# Patient Record
Sex: Female | Born: 1985 | Race: White | Hispanic: Yes | Marital: Single | Smoking: Never smoker
Health system: Southern US, Community
[De-identification: ages and names within clinical notes are randomized; demographics above are authoritative.]

## PROBLEM LIST (undated history)

## (undated) DIAGNOSIS — F988 Other specified behavioral and emotional disorders with onset usually occurring in childhood and adolescence: Secondary | ICD-10-CM

---

## 2005-08-12 ENCOUNTER — Emergency Department (HOSPITAL_COMMUNITY): Admission: EM | Admit: 2005-08-12 | Discharge: 2005-08-12 | Payer: Self-pay | Admitting: Emergency Medicine

## 2009-02-13 ENCOUNTER — Emergency Department (HOSPITAL_COMMUNITY): Admission: EM | Admit: 2009-02-13 | Discharge: 2009-02-13 | Payer: Self-pay | Admitting: Emergency Medicine

## 2011-08-10 ENCOUNTER — Emergency Department (HOSPITAL_COMMUNITY)
Admission: EM | Admit: 2011-08-10 | Discharge: 2011-08-10 | Disposition: A | Discharge status: Home / Self Care | Payer: PRIVATE HEALTH INSURANCE | Attending: Emergency Medicine | Admitting: Emergency Medicine

## 2011-08-10 ENCOUNTER — Emergency Department (HOSPITAL_COMMUNITY): Payer: PRIVATE HEALTH INSURANCE

## 2011-08-10 DIAGNOSIS — S6990XA Unspecified injury of unspecified wrist, hand and finger(s), initial encounter: Secondary | ICD-10-CM | POA: Insufficient documentation

## 2011-08-10 DIAGNOSIS — R51 Headache: Secondary | ICD-10-CM | POA: Insufficient documentation

## 2011-08-10 DIAGNOSIS — M79609 Pain in unspecified limb: Secondary | ICD-10-CM | POA: Insufficient documentation

## 2011-08-10 DIAGNOSIS — S59909A Unspecified injury of unspecified elbow, initial encounter: Secondary | ICD-10-CM | POA: Insufficient documentation

## 2011-08-10 DIAGNOSIS — IMO0002 Reserved for concepts with insufficient information to code with codable children: Secondary | ICD-10-CM | POA: Insufficient documentation

## 2011-12-28 ENCOUNTER — Encounter (HOSPITAL_COMMUNITY): Payer: Self-pay | Admitting: Emergency Medicine

## 2011-12-28 ENCOUNTER — Emergency Department (INDEPENDENT_AMBULATORY_CARE_PROVIDER_SITE_OTHER)
Admission: EM | Admit: 2011-12-28 | Discharge: 2011-12-28 | Disposition: A | Discharge status: Home / Self Care | Payer: PRIVATE HEALTH INSURANCE | Source: Home / Self Care | Attending: Emergency Medicine | Admitting: Emergency Medicine

## 2011-12-28 DIAGNOSIS — H9312 Tinnitus, left ear: Secondary | ICD-10-CM

## 2011-12-28 DIAGNOSIS — H9319 Tinnitus, unspecified ear: Secondary | ICD-10-CM

## 2011-12-28 MED ORDER — MECLIZINE HCL 12.5 MG PO TABS: 12.5000 mg | ORAL_TABLET | Freq: Three times a day (TID) | ORAL | Status: AC | PRN

## 2011-12-28 NOTE — Discharge Instructions (Signed)
   As discussed during your exam no signs of obvious infection or eardrum perforation were noted. I assume you're symptom and discomfort will improve rapidly. If symptoms persist beyond 7-10 days or new symptoms as discussed as nausea vomiting or changes in balance or equilibrium followup with the ENT doctor. Take his prescribed medicine every 8 hours for the next 3 days.   Tinnitus Sounds you hear in your ears and coming from within the ear is called tinnitus. This can be a symptom of many ear disorders. It is often associated with hearing loss.  Tinnitus can be seen with:  Infections.   Ear blockages such as wax buildup.   Meniere's disease.   Ear damage.   Inherited.   Occupational causes.  While irritating, it is not usually a threat to health. When the cause of the tinnitus is wax, infection in the middle ear, or foreign body it is easily treated. Hearing loss will usually be reversible.  TREATMENT  When treating the underlying cause does not get rid of tinnitus, it may be necessary to get rid of the unwanted sound by covering it up with more pleasant background noises. This may include music, the radio etc. There are tinnitus maskers which can be worn which produce background noise to cover up the tinnitus. Avoid all medications which tend to make tinnitus worse such as alcohol, caffeine, aspirin, and nicotine. There are many soothing background tapes such as rain, ocean, thunderstorms, etc. These soothing sounds help with sleeping or resting. Keep all follow-up appointments and referrals. This is important to identify the cause of the problem. It also helps avoid complications, impaired hearing, disability, or chronic pain. Document Released: 10/28/2005 Document Revised: 07/10/2011 Document Reviewed: 06/15/2008 [facility] Patient Information 2012 [place], [place].

## 2011-12-28 NOTE — ED Notes (Signed)
Ringing in left ear onset yesterday around 3:30 P.M.initially hearing was "muted"

## 2011-12-28 NOTE — ED Provider Notes (Addendum)
History     CSN: [id number]  Arrival date & time 12/28/11  1509   First MD Initiated Contact with Patient 12/28/11 1559      Chief Complaint  Patient presents with  . Tinnitus    (Consider location/radiation/quality/duration/timing/severity/associated sxs/prior treatment) HPI Comments: Patient presents to the urgent care Center after having experienced a sudden onset of left ear ringing sensation in discomfort. She was at occupational health and she felt that a machine generated a sound in which she felt immediately this discomfort in her ear and have been feeling this ringing since then. Attempted several things as recommended by her mother including a bottle of perfume a cancer mastoid process and also vaporizing chest rub in her ear canal.  Patient and denies any other symptoms like fever vomiting or visual changes denies dizziness or vertigo. Admits that today is less intense but still feel this ringing sound. Denies any ear injury or trauma or any ear discharge and no recent and/or or outdoor swimming activities. Denies cleaning her years recently.  The history is provided by the patient.    History reviewed. No pertinent past medical history.  History reviewed. No pertinent past surgical history.  Family History  Problem Relation Age of Onset  . Diabetes Father   . Hypertension Father     History  Substance Use Topics  . Smoking status: Never Smoker   . Smokeless tobacco: Not on file  . Alcohol Use: No    OB History    Grav Para Term Preterm Abortions TAB SAB Ect Mult Living                  Review of Systems  Constitutional: Negative for fever, activity change and appetite change.  HENT: Positive for ear pain and tinnitus. Negative for congestion, rhinorrhea, mouth sores, trouble swallowing, neck pain, dental problem and voice change.   Eyes: Negative for visual disturbance.  Respiratory: Negative for cough and shortness of breath.   Cardiovascular: Negative  for chest pain.  Gastrointestinal: Negative for nausea and vomiting.  Skin: Negative for rash.  Neurological: Negative for dizziness, weakness and headaches.    Allergies  Honey; Pineapple; Pollen extract; Shellfish allergy; and Sudafed  Home Medications   Current Outpatient Rx  Name Route Sig Dispense Refill  . VAPORIZING CHEST RUB 4.8-1.2-2.6 % EX OINT Apply externally Apply topically.    . DESOGESTREL-ETHINYL ESTRADIOL 0.15-30 MG-MCG PO TABS Oral Take 1 tablet by mouth daily.    Marland Kitchen OVER THE COUNTER MEDICATION  Ring relief ear drops bought at [facility]    . MECLIZINE HCL 12.5 MG PO TABS Oral Take 1 tablet (12.5 mg total) by mouth 3 (three) times daily as needed. 15 tablet 0    BP 130/79  Pulse 72  Temp(Src) 99.3 F (37.4 C) (Oral)  Resp 20  SpO2 100%  LMP 12/25/2011  Physical Exam  Nursing note and vitals reviewed. Constitutional: She appears well-developed and well-nourished.  HENT:  Head: Normocephalic.  Right Ear: Hearing, tympanic membrane and ear canal normal. No lacerations. No drainage, swelling or tenderness. No foreign bodies. No mastoid tenderness. Tympanic membrane is not injected, not perforated, not retracted and not bulging. No middle ear effusion. No hemotympanum.  Left Ear: Hearing, external ear and ear canal normal. No lacerations. No drainage, swelling or tenderness. No foreign bodies. No mastoid tenderness. Tympanic membrane is not injected, not perforated, not retracted and not bulging.  No middle ear effusion. No hemotympanum.  Ears:  Mouth/Throat: No oropharyngeal exudate.  Eyes: Conjunctivae are normal. Pupils are equal, round, and reactive to light. Right eye exhibits no discharge. Left eye exhibits no discharge. Left eye exhibits normal extraocular motion and no nystagmus.    Neck: Neck supple. No JVD present.  Skin: Skin is warm. No erythema.    ED Course  Procedures (including critical care time)  Labs Reviewed - No data to display No results  found.   1. Tinnitus of left ear       MDM  Left ear tinnitus sudden onset yesterday after a machine and do sound patient described hearing was muted and since then been perceiving this ringing sound much more intense yesterday today seems less and intermittent in nature. Patient denies any visual changes headache or disequilibrium and no gastrointestinal symptoms.        Jimmie Molly, MD 12/28/11 1648  Jimmie Molly, MD 12/28/11 [phone]

## 2012-04-30 ENCOUNTER — Encounter (HOSPITAL_COMMUNITY): Payer: Self-pay | Admitting: Family Medicine

## 2012-04-30 ENCOUNTER — Emergency Department (HOSPITAL_COMMUNITY)
Admission: EM | Admit: 2012-04-30 | Discharge: 2012-04-30 | Disposition: A | Discharge status: Home / Self Care | Payer: PRIVATE HEALTH INSURANCE | Attending: Emergency Medicine | Admitting: Emergency Medicine

## 2012-04-30 DIAGNOSIS — R197 Diarrhea, unspecified: Secondary | ICD-10-CM | POA: Insufficient documentation

## 2012-04-30 DIAGNOSIS — R42 Dizziness and giddiness: Secondary | ICD-10-CM | POA: Insufficient documentation

## 2012-04-30 DIAGNOSIS — Z833 Family history of diabetes mellitus: Secondary | ICD-10-CM | POA: Insufficient documentation

## 2012-04-30 DIAGNOSIS — R112 Nausea with vomiting, unspecified: Secondary | ICD-10-CM | POA: Insufficient documentation

## 2012-04-30 DIAGNOSIS — Z8249 Family history of ischemic heart disease and other diseases of the circulatory system: Secondary | ICD-10-CM | POA: Insufficient documentation

## 2012-04-30 LAB — POCT I-STAT, CHEM 8
BUN: 7 mg/dL (ref 6–23)
Calcium, Ion: 1.21 mmol/L (ref 1.12–1.32)
Chloride: 105 meq/L (ref 96–112)
Creatinine, Ser: 0.7 mg/dL (ref 0.50–1.10)
Glucose, Bld: 78 mg/dL (ref 70–99)
HCT: 38 % (ref 36.0–46.0)
Hemoglobin: 12.9 g/dL (ref 12.0–15.0)
Potassium: 3.4 meq/L — ABNORMAL LOW (ref 3.5–5.1)
Sodium: 143 meq/L (ref 135–145)
TCO2: 24 mmol/L (ref 0–100)

## 2012-04-30 LAB — URINALYSIS, ROUTINE W REFLEX MICROSCOPIC
Bilirubin Urine: NEGATIVE
Glucose, UA: NEGATIVE mg/dL
Hgb urine dipstick: NEGATIVE
Ketones, ur: NEGATIVE mg/dL
Leukocytes, UA: NEGATIVE
Nitrite: NEGATIVE
Protein, ur: NEGATIVE mg/dL
Specific Gravity, Urine: 1.005 (ref 1.005–1.030)
Urobilinogen, UA: 0.2 mg/dL (ref 0.0–1.0)
pH: 6 (ref 5.0–8.0)

## 2012-04-30 LAB — PREGNANCY, URINE: Preg Test, Ur: NEGATIVE

## 2012-04-30 MED ORDER — SODIUM CHLORIDE 0.9 % IV SOLN
1000.0000 mL | INTRAVENOUS | Status: DC
  Administered 2012-04-30: 1000 mL via INTRAVENOUS

## 2012-04-30 MED ORDER — SODIUM CHLORIDE 0.9 % IV SOLN
1000.0000 mL | Freq: Once | INTRAVENOUS | Status: AC
  Administered 2012-04-30: 1000 mL via INTRAVENOUS

## 2012-04-30 MED ORDER — ONDANSETRON HCL 4 MG/2ML IJ SOLN
4.0000 mg | Freq: Once | INTRAMUSCULAR | Status: AC
  Administered 2012-04-30: 4 mg via INTRAVENOUS
  Filled 2012-04-30: qty 2

## 2012-04-30 MED ORDER — LOPERAMIDE HCL 2 MG PO CAPS: 2.0000 mg | ORAL_CAPSULE | Freq: Four times a day (QID) | ORAL | Status: AC | PRN

## 2012-04-30 MED ORDER — ONDANSETRON 8 MG PO TBDP: 8.0000 mg | ORAL_TABLET | Freq: Three times a day (TID) | ORAL | Status: AC | PRN

## 2012-04-30 NOTE — ED Notes (Signed)
Pt ambulating independently w/ steady gait on d/c in no acute distress, A&Ox4. D/c instructions reviewed w/ pt - pt denies any further questions or concerns at present.  

## 2012-04-30 NOTE — ED Notes (Signed)
Patient states "I think I am dehydrated." Reports nausea x 3 days, diarrhea x 10 episodes and vomiting x 2 episodes. Tried sodium bicarb and pepto bismal without relief. States "my stomach feels really swollen."

## 2012-04-30 NOTE — ED Provider Notes (Signed)
History     CSN: [id number]  Arrival date & time 04/30/12  0114   First MD Initiated Contact with Patient 04/30/12 [phone]      Chief Complaint  Patient presents with  . Nausea  . Diarrhea     HPI Per nursing notes: Patient states "I think I am dehydrated." Reports nausea x 3 days, diarrhea x 10 episodes and vomiting x 2 episodes. Tried sodium bicarb and pepto bismal without relief. States "my stomach feels really swollen." Symptoms started Monday am.  Pt has felt lightheaded and dizzy.  No abdominal pain.  No known fever.  Sometimes some stomach cramping.  PT has not been able to keep anything down.    History reviewed. No pertinent past medical history.  History reviewed. No pertinent past surgical history.  Family History  Problem Relation Age of Onset  . Diabetes Father   . Hypertension Father     History  Substance Use Topics  . Smoking status: Never Smoker   . Smokeless tobacco: Not on file  . Alcohol Use: No    OB History    Grav Para Term Preterm Abortions TAB SAB Ect Mult Living                  Review of Systems  Constitutional: Negative for fever.  Gastrointestinal: Negative for vomiting.  Genitourinary: Negative for dysuria.  Neurological: Positive for dizziness. Negative for syncope.  All other systems reviewed and are negative.    Allergies  Honey; Pineapple; Pollen extract; Shellfish allergy; and Sudafed  Home Medications  No current outpatient prescriptions on file.  BP 113/84  Pulse 83  Temp 98.2 F (36.8 C) (Oral)  Resp 16  SpO2 100%  LMP 04/29/2012  Physical Exam  Nursing note and vitals reviewed. Constitutional: She appears well-developed and well-nourished. No distress.  HENT:  Head: Normocephalic and atraumatic.  Right Ear: External ear normal.  Left Ear: External ear normal.  Eyes: Conjunctivae are normal. Right eye exhibits no discharge. Left eye exhibits no discharge. No scleral icterus.  Neck: Neck supple. No tracheal  deviation present.  Cardiovascular: Normal rate, regular rhythm and intact distal pulses.   Pulmonary/Chest: Effort normal and breath sounds normal. No stridor. No respiratory distress. She has no wheezes. She has no rales.  Abdominal: Soft. Bowel sounds are normal. She exhibits no distension. There is no tenderness. There is no rebound and no guarding.  Musculoskeletal: She exhibits no edema and no tenderness.  Neurological: She is alert. She has normal strength. No sensory deficit. Cranial nerve deficit:  no gross defecits noted. She exhibits normal muscle tone. She displays no seizure activity. Coordination normal.  Skin: Skin is warm and dry. No rash noted.  Psychiatric: She has a normal mood and affect.    ED Course  Procedures (including critical care time)  Labs Reviewed  POCT I-STAT, CHEM 8 - Abnormal; Notable for the following:    Potassium 3.4 (*)     All other components within normal limits  URINALYSIS, ROUTINE W REFLEX MICROSCOPIC  PREGNANCY, URINE   No results found.   1. Diarrhea       MDM  Pt without signs of dehydration.  No abdominal ttp.  Possible viral etiology.  Will dc home on oral antiemetics and antidiarrheal agents for symptomatic relief.  Follow up with PCP if not getting better.        Celene Kras, MD 04/30/12 [phone]

## 2012-04-30 NOTE — Discharge Instructions (Signed)
Diet for Diarrhea, Adult Having frequent, runny stools (diarrhea) has many causes. Diarrhea may be caused or worsened by food or drink. Diarrhea may be relieved by changing your diet. IF YOU ARE NOT TOLERATING SOLID FOODS:  Drink enough water and fluids to keep your urine clear or pale yellow.   Avoid sugary drinks and sodas as well as milk-based beverages.   Avoid beverages containing caffeine and alcohol.   You may try rehydrating beverages. You can make your own by following this recipe:    tsp table salt.    tsp baking soda.   ? tsp salt substitute (potassium chloride).   1 tbs + 1 tsp sugar.   1 qt water.  As your stools become more solid, you can start eating solid foods. Add foods one at a time. If a certain food causes your diarrhea to get worse, avoid that food and try other foods. A low fiber, low-fat, and lactose-free diet is recommended. Small, frequent meals may be better tolerated.  Starches  Allowed:  White, French, and pita breads, plain rolls, buns, bagels. Plain muffins, matzo. Soda, saltine, or graham crackers. Pretzels, melba toast, zwieback. Cooked cereals made with water: cornmeal, farina, cream cereals. Dry cereals: refined corn, wheat, rice. Potatoes prepared any way without skins, refined macaroni, spaghetti, noodles, refined rice.   Avoid:  Bread, rolls, or crackers made with whole wheat, multi-grains, rye, bran seeds, nuts, or coconut. Corn tortillas or taco shells. Cereals containing whole grains, multi-grains, bran, coconut, nuts, or raisins. Cooked or dry oatmeal. Coarse wheat cereals, granola. Cereals advertised as "high-fiber." Potato skins. Whole grain pasta, wild or brown rice. Popcorn. Sweet potatoes/yams. Sweet rolls, doughnuts, waffles, pancakes, sweet breads.  Vegetables  Allowed: Strained tomato and vegetable juices. Most well-cooked and canned vegetables without seeds. Fresh: Tender lettuce, cucumber without the skin, cabbage, spinach, bean  sprouts.   Avoid: Fresh, cooked, or canned: Artichokes, baked beans, beet greens, broccoli, Brussels sprouts, corn, kale, legumes, peas, sweet potatoes. Cooked: Green or red cabbage, spinach. Avoid large servings of any vegetables, because vegetables shrink when cooked, and they contain more fiber per serving than fresh vegetables.  Fruit  Allowed: All fruit juices except prune juice. Cooked or canned: Apricots, applesauce, cantaloupe, cherries, fruit cocktail, grapefruit, grapes, kiwi, mandarin oranges, peaches, pears, plums, watermelon. Fresh: Apples without skin, ripe banana, grapes, cantaloupe, cherries, grapefruit, peaches, oranges, plums. Keep servings limited to  cup or 1 piece.   Avoid: Fresh: Apple with skin, apricots, mango, pears, raspberries, strawberries. Prune juice, stewed or dried prunes. Dried fruits, raisins, dates. Large servings of all fresh fruits.  Meat and Meat Substitutes  Allowed: Ground or well-cooked tender beef, ham, veal, lamb, pork, or poultry. Eggs, plain cheese. Fish, oysters, shrimp, lobster, other seafoods. Liver, organ meats.   Avoid: Tough, fibrous meats with gristle. Peanut butter, smooth or chunky. Cheese, nuts, seeds, legumes, dried peas, beans, lentils.  Milk  Allowed: Yogurt, lactose-free milk, kefir, drinkable yogurt, buttermilk, soy milk.   Avoid: Milk, chocolate milk, beverages made with milk, such as milk shakes.  Soups  Allowed: Bouillon, broth, or soups made from allowed foods. Any strained soup.   Avoid: Soups made from vegetables that are not allowed, cream or milk-based soups.  Desserts and Sweets  Allowed: Sugar-free gelatin, sugar-free frozen ice pops made without sugar alcohol.   Avoid: Plain cakes and cookies, pie made with allowed fruit, pudding, custard, cream pie. Gelatin, fruit, ice, sherbet, frozen ice pops. Ice cream, ice milk without nuts. Plain hard candy,   honey, jelly, molasses, syrup, sugar, chocolate syrup, gumdrops,  marshmallows.  Fats and Oils  Allowed: Avoid any fats and oils.   Avoid: Seeds, nuts, olives, avocados. Margarine, butter, cream, mayonnaise, salad oils, plain salad dressings made from allowed foods. Plain gravy, crisp bacon without rind.  Beverages  Allowed: Water, decaffeinated teas, oral rehydration solutions, sugar-free beverages.   Avoid: Fruit juices, caffeinated beverages (coffee, tea, soda or pop), alcohol, sports drinks, or lemon-lime soda or pop.  Condiments  Allowed: Ketchup, mustard, horseradish, vinegar, cream sauce, cheese sauce, cocoa powder. Spices in moderation: allspice, basil, bay leaves, celery powder or leaves, cinnamon, cumin powder, curry powder, ginger, mace, marjoram, onion or garlic powder, oregano, paprika, parsley flakes, ground pepper, rosemary, sage, savory, tarragon, thyme, turmeric.   Avoid: Coconut, honey.  Weight Monitoring: Weigh yourself every day. You should weigh yourself in the morning after you urinate and before you eat breakfast. Wear the same amount of clothing when you weigh yourself. Record your weight daily. Bring your recorded weights to your clinic visits. Tell your caregiver right away if you have gained 3 lb/1.4 kg or more in 1 day, 5 lb/2.3 kg in a week, or whatever amount you were told to report. SEEK IMMEDIATE MEDICAL CARE IF:   You are unable to keep fluids down.   You start to throw up (vomit) or diarrhea keeps coming back (persistent).   Abdominal pain develops, increases, or can be felt in one place (localizes).   You have an oral temperature above 102 F (38.9 C), not controlled by medicine.   Diarrhea contains blood or mucus.   You develop excessive weakness, dizziness, fainting, or extreme thirst.  MAKE SURE YOU:   Understand these instructions.   Will watch your condition.   Will get help right away if you are not doing well or get worse.  Document Released: 01/18/2004 Document Revised: 10/17/2011 Document Reviewed:  05/11/2009 [employer] Patient Information 2012 [employer]. 

## 2012-04-30 NOTE — ED Notes (Signed)
Pt reports multiple episodes of loose stools x3 days, pt states she has been feeling weak d/t this and experiencing nausea as well. Pt unsure if any fever present. Pt A&Ox4 in no acute distress.

## 2012-10-06 ENCOUNTER — Ambulatory Visit (INDEPENDENT_AMBULATORY_CARE_PROVIDER_SITE_OTHER): Payer: BC Managed Care – PPO | Admitting: Sports Medicine

## 2012-10-06 VITALS — BP 100/62 | Ht 62.5 in | Wt 135.0 lb

## 2012-10-06 DIAGNOSIS — M79609 Pain in unspecified limb: Secondary | ICD-10-CM

## 2012-10-06 DIAGNOSIS — M79671 Pain in right foot: Secondary | ICD-10-CM | POA: Insufficient documentation

## 2012-10-06 DIAGNOSIS — R269 Unspecified abnormalities of gait and mobility: Secondary | ICD-10-CM

## 2012-10-06 NOTE — Assessment & Plan Note (Signed)
She was given lateral heel wedges to use bilaterally  She is to test these for one month but if they help we will her to use them in all shoes  We also gave her a series of exercises to strengthen her arch muscles with heel raises in 3 positions  Recheck when necessary

## 2012-10-06 NOTE — Progress Notes (Signed)
  Subjective:    Patient ID: Debra Howard, female    DOB: 1986/01/29, 26 y.o.   MRN: [id number]  HPI  Pt presents to clinic for evaluation of forefoot and lateral foot pain x 6 months. Shoes wear out laterally. Has pain with increased standing and walking. 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   History of injury to the feet She has not tried any corrections She does not see any difference in any of her shoes     Review of Systems     Objective:   Physical Exam  Pleasant and in no acute distress  Bilateral feet show any loss of the longitudinal arch There is a short first metatarsal segment bilaterally The second toe is approximately 1 cm longer than the first bilaterally There is no tenderness to palpation of either foot No abnormal calluses  Walking gait reveals that she has excess supination and then some increase in compensatory pronation  When we correct the supination she is more comfortable her walking gait were normal as was      Assessment & Plan:

## 2012-10-06 NOTE — Patient Instructions (Addendum)
Please try heel wedges - with thick part towards the outside of your heel  Do 3 sets of 15 heel raises daily   1 set with toes pointed out 1 set with toes pointed in  1 set of toes pointed forward  If this correction in your shoes helps, stop by our office and we will show you how to order additional pads  If the correction does not work please make a follow up appointment  Thank you for seeing [place] today!

## 2012-10-06 NOTE — Assessment & Plan Note (Signed)
I think this is primarily bile mechanical from her abnormal gait  I do not see a specific injury

## 2014-07-03 ENCOUNTER — Encounter (HOSPITAL_COMMUNITY): Payer: Self-pay | Admitting: Emergency Medicine

## 2014-07-03 ENCOUNTER — Emergency Department (HOSPITAL_COMMUNITY): Payer: 59

## 2014-07-03 ENCOUNTER — Emergency Department (HOSPITAL_COMMUNITY)
Admission: EM | Admit: 2014-07-03 | Discharge: 2014-07-03 | Disposition: A | Discharge status: Home / Self Care | Payer: 59 | Attending: Emergency Medicine | Admitting: Emergency Medicine

## 2014-07-03 DIAGNOSIS — R51 Headache: Secondary | ICD-10-CM

## 2014-07-03 DIAGNOSIS — Z79899 Other long term (current) drug therapy: Secondary | ICD-10-CM | POA: Diagnosis not present

## 2014-07-03 DIAGNOSIS — W1809XA Striking against other object with subsequent fall, initial encounter: Secondary | ICD-10-CM | POA: Insufficient documentation

## 2014-07-03 DIAGNOSIS — S46909A Unspecified injury of unspecified muscle, fascia and tendon at shoulder and upper arm level, unspecified arm, initial encounter: Secondary | ICD-10-CM | POA: Diagnosis not present

## 2014-07-03 DIAGNOSIS — S4980XA Other specified injuries of shoulder and upper arm, unspecified arm, initial encounter: Secondary | ICD-10-CM | POA: Diagnosis not present

## 2014-07-03 DIAGNOSIS — F988 Other specified behavioral and emotional disorders with onset usually occurring in childhood and adolescence: Secondary | ICD-10-CM | POA: Diagnosis not present

## 2014-07-03 DIAGNOSIS — S060X0A Concussion without loss of consciousness, initial encounter: Secondary | ICD-10-CM

## 2014-07-03 DIAGNOSIS — S0990XA Unspecified injury of head, initial encounter: Secondary | ICD-10-CM | POA: Diagnosis present

## 2014-07-03 DIAGNOSIS — Y9389 Activity, other specified: Secondary | ICD-10-CM | POA: Diagnosis not present

## 2014-07-03 DIAGNOSIS — Y9289 Other specified places as the place of occurrence of the external cause: Secondary | ICD-10-CM | POA: Diagnosis not present

## 2014-07-03 DIAGNOSIS — R519 Headache, unspecified: Secondary | ICD-10-CM

## 2014-07-03 HISTORY — DX: Other specified behavioral and emotional disorders with onset usually occurring in childhood and adolescence: F98.8

## 2014-07-03 MED ORDER — PROCHLORPERAZINE MALEATE 10 MG PO TABS: 10.0000 mg | ORAL_TABLET | Freq: Two times a day (BID) | ORAL | Status: DC | PRN

## 2014-07-03 MED ORDER — IBUPROFEN 800 MG PO TABS
800.0000 mg | ORAL_TABLET | Freq: Once | ORAL | Status: AC
  Administered 2014-07-03: 800 mg via ORAL
  Filled 2014-07-03: qty 1

## 2014-07-03 NOTE — Discharge Instructions (Signed)
Return to the emergency room with worsening of symptoms, new symptoms or with symptoms that are concerning, severe headache, vomiting, visual changes. RICE: Rest, Ice (three cycles of 20 mins on, 62mins off at least twice a day), compression/brace, elevation. Ibuprofen 800mg  (4 tablets 200mg ) three times a day for 3-5 days and then as needed for HA and shoulder pain. Shoulder sling for 24 hours for comfort. Follow up with primary care provider in 2-3 days. Please call your doctor for a follow up appointment within 24 to 48 hours and please let them know you were seen in the Emergency Department. Have them acquire all of your records so that they can discuss the findings with you and formulate a treatment plan to fully care for your new and ongoing medical problems.

## 2014-07-03 NOTE — ED Notes (Signed)
Pt stood on back of couch to catch mosquito. [place] [address] backwards striking side of head.  Does not recall LOC.  Post confusion since.  Nausea with no vomiting.

## 2014-07-03 NOTE — ED Notes (Signed)
PA at bedside.

## 2014-07-03 NOTE — ED Provider Notes (Signed)
CSN: [id number]     Arrival date & time 07/03/14  1248 History  This chart was scribed for non-physician practitioner, Al Corpus, PA-C working with Pamella Pert, MD, by Erling Conte, ED Scribe. This patient was seen in room [facility] and the patient's care was started at 1:02 PM.    Chief Complaint  Patient presents with  . Headache      The history is provided by the patient. No language interpreter was used.   HPI Comments: Debra Howard is a 28 y.o. female who presents to the Emergency Department complaining of a intermittent, "throbbing", moderate, "4/10", HA that radiates behind her ear and throughout her face for 1 day. HA is improving. HA worse with concentration and light. She states that yesterday she was on top of her couch and was trying to kill an insect and she fell from her couch and hit her head on the floor. She states when she fell, she landed on her left side and hurt her left shoulder and left side of her ribcage. Fall unwitnessed. She notes that her left shoulder pain is exacerbated by rotation and movement. She denies any LOC from the fall. She states she had some associated blurred vision that has now resolved, left sided back pain, photophobia, nausea, and confusion since the head injury. No confusion in ED. No loss of bowel or bladder continence. States she did have some chest pain yesterday accompanied with a panic attack but that she is no longer having chest pain. She denies any SOB, weakness, numbness, slurred speech, or emesis. She has not taken any medications. No anticoagulant use. Patient denies the possibility of being pregnant.  Past Medical History  Diagnosis Date  . ADD (attention deficit disorder)    History reviewed. No pertinent past surgical history. Family History  Problem Relation Age of Onset  . Diabetes Father   . Hypertension Father    History  Substance Use Topics  . Smoking status: Never Smoker   . Smokeless tobacco: Not  on file  . Alcohol Use: No   OB History   Grav Para Term Preterm Abortions TAB SAB Ect Mult Living                 Review of Systems  Constitutional: Negative for fever and chills.  Eyes: Positive for photophobia and visual disturbance (now resolved).  Respiratory: Negative for shortness of breath.   Cardiovascular: Negative for chest pain.  Gastrointestinal: Positive for nausea. Negative for vomiting.  Musculoskeletal: Positive for arthralgias (left shoulder) and back pain.  Neurological: Positive for headaches. Negative for syncope, speech difficulty, weakness and numbness.  Psychiatric/Behavioral: Positive for confusion.      Allergies  Honey; Pineapple; Pollen extract; Shellfish allergy; and Sudafed  Home Medications   Prior to Admission medications   Medication Sig Start Date End Date Taking? Authorizing Provider  amphetamine-dextroamphetamine (ADDERALL XR) 30 MG 24 hr capsule Take 30 mg by mouth daily.   Yes Historical Provider, MD  Multiple Vitamin (MULTIVITAMIN WITH MINERALS) TABS tablet Take 1 tablet by mouth daily.   Yes Historical Provider, MD  prochlorperazine (COMPAZINE) 10 MG tablet Take 1 tablet (10 mg total) by mouth 2 (two) times daily as needed for nausea or vomiting (Nausea ). 07/03/14   Pura Spice, PA-C   Triage Vitals: BP 136/78  Pulse 96  Temp(Src) 98.2 F (36.8 C) (Oral)  Resp 18  SpO2 100%  Physical Exam  Nursing note and vitals reviewed. Constitutional: She is oriented to person,  place, and time. She appears well-developed and well-nourished. No distress.  HENT:  Head: Normocephalic and atraumatic.  Mouth/Throat: Oropharynx is clear and moist.  No hematoma but left sided scalp tenderness  Eyes: Conjunctivae and EOM are normal. Pupils are equal, round, and reactive to light.  Neck: Normal range of motion. Neck supple. No tracheal deviation present.  Cardiovascular: Normal rate, regular rhythm, normal heart sounds and intact distal pulses.    Pulmonary/Chest: Effort normal and breath sounds normal. No respiratory distress.  Abdominal: Soft. Bowel sounds are normal. She exhibits no distension. There is no tenderness.  Musculoskeletal: Normal range of motion. She exhibits tenderness.  Left shoulder tenderness without signs of deformity. Mild swelling. No lacerations. Full ROM with pain. Bilateral radial pulses 2+  Neurological: She is alert and oriented to person, place, and time. No cranial nerve deficit. She exhibits normal muscle tone. Coordination normal.  Strength and sensation intact in upper and lower extremities. Intact rapid alternating movements and finger to nose and heel to shin. Negative romberg and normal gait.  Skin: Skin is warm and dry.  Psychiatric: She has a normal mood and affect. Her speech is normal and behavior is normal.  Patient alert, responses appropriate and remembers details of the injury. Logical thought content and judgment. Follows commands.    ED Course  Procedures (including critical care time)  DIAGNOSTIC STUDIES: Oxygen Saturation is 100% on RA, normal by my interpretation.    COORDINATION OF CARE: 1:09 PM- Discussed with pt not to proceed forward with CT due to absence of focal neurological weakness, nausea, emesis or weakness and improved HA. No suspicion of intercranial bleed. Pt agrees with plan.   Labs Review Labs Reviewed - No data to display  Imaging Review Dg Shoulder Left  07/03/2014   CLINICAL DATA:  [place] from top of couch and landed on head and shoulder.  EXAM: LEFT SHOULDER - 2+ VIEW  COMPARISON:  None.  FINDINGS: There is no evidence of fracture or dislocation. There is no evidence of arthropathy or other focal bone abnormality. Soft tissues are unremarkable.  IMPRESSION: Negative.   Electronically Signed   By: Kerby Moors M.D.   On: 07/03/2014 13:44     EKG Interpretation None     Meds given in ED:  Medications  ibuprofen (ADVIL,MOTRIN) tablet 800 mg (800 mg Oral  Given 07/03/14 1327)    New Prescriptions   PROCHLORPERAZINE (COMPAZINE) 10 MG TABLET    Take 1 tablet (10 mg total) by mouth 2 (two) times daily as needed for nausea or vomiting (Nausea ).      MDM   Final diagnoses:  Head injury, initial encounter  Concussion, without loss of consciousness, initial encounter  Acute nonintractable headache, unspecified headache type   Patient is a 28 year old female who presents after a mechanical fall early this morning from 3 foot high couch. Patient endorses head injury, but denies LOC. She had immediate blurry vision, nausea, confusion but they have resolved. Immediate HA which is improving. No emesis. She endorses photosensitivity and difficulty with concentrating. VSS no focal neurological deficits. Completely benign neurological exam. Low risk of intracranial traumatic findings by [place] CT Head Rule suggesting that CT unnecessary. I doubt intracranial pathology or subarachnoid hemorrhage. Patient most likely had a concussion. Treat HA with compazine as needed. NSAIDs also help with headaches.  Patient also complains of left shoulder pain. Patient denies any chance of pregnancy stating that she is currently on her period and given ibuprofen for HA  and shoulder pain with improvement. Xray of shoulder without fracture or dislocation. Provided shoulder sling for comfort for first 24 hours. Recommend RICE and NSAIDs. Patient also has rib pain and treat with RICE and NSAIDs. Patient did not want an x-ray.  Follow up with PCP in 2-3 days for further management of concussion, shoulder and rib pain.  Discussed return precautions with patient. Discussed all results and patient verbalizes understanding and agrees with plan.  I personally performed the services described in this documentation, which was scribed in my presence. The recorded information has been reviewed and is accurate.       Pura Spice, PA-C 07/04/14 [phone]

## 2014-07-05 NOTE — ED Provider Notes (Signed)
Medical screening examination/treatment/procedure(s) were performed by non-physician practitioner and as supervising physician I was immediately available for consultation/collaboration.   EKG Interpretation None        Pamella Pert, MD 07/05/14 1210

## 2015-02-09 ENCOUNTER — Ambulatory Visit
Admission: RE | Admit: 2015-02-09 | Discharge: 2015-02-09 | Disposition: A | Discharge status: Home / Self Care | Payer: 59 | Source: Ambulatory Visit | Attending: Family | Admitting: Family

## 2015-02-09 ENCOUNTER — Other Ambulatory Visit: Payer: Self-pay | Admitting: Family

## 2015-02-09 DIAGNOSIS — S90122A Contusion of left lesser toe(s) without damage to nail, initial encounter: Secondary | ICD-10-CM

## 2015-02-23 ENCOUNTER — Other Ambulatory Visit: Payer: Self-pay | Admitting: Family Medicine

## 2015-02-23 ENCOUNTER — Other Ambulatory Visit (HOSPITAL_COMMUNITY)
Admission: RE | Admit: 2015-02-23 | Discharge: 2015-02-23 | Disposition: A | Discharge status: Home / Self Care | Payer: 59 | Source: Ambulatory Visit | Attending: Family Medicine | Admitting: Family Medicine

## 2015-02-23 DIAGNOSIS — Z01411 Encounter for gynecological examination (general) (routine) with abnormal findings: Secondary | ICD-10-CM | POA: Diagnosis not present

## 2015-02-23 DIAGNOSIS — N949 Unspecified condition associated with female genital organs and menstrual cycle: Secondary | ICD-10-CM

## 2015-02-23 DIAGNOSIS — Z1151 Encounter for screening for human papillomavirus (HPV): Secondary | ICD-10-CM | POA: Diagnosis present

## 2015-02-27 ENCOUNTER — Other Ambulatory Visit: Payer: 59

## 2015-02-27 LAB — CYTOLOGY - PAP

## 2015-03-15 ENCOUNTER — Encounter: Payer: Self-pay | Admitting: *Deleted

## 2015-03-16 ENCOUNTER — Encounter: Payer: Self-pay | Admitting: Obstetrics & Gynecology

## 2015-03-16 DIAGNOSIS — D270 Benign neoplasm of right ovary: Secondary | ICD-10-CM | POA: Insufficient documentation

## 2015-03-22 ENCOUNTER — Ambulatory Visit: Payer: 59 | Admitting: Obstetrics & Gynecology

## 2015-04-27 ENCOUNTER — Encounter (HOSPITAL_BASED_OUTPATIENT_CLINIC_OR_DEPARTMENT_OTHER): Payer: Self-pay | Admitting: *Deleted

## 2015-04-27 NOTE — Progress Notes (Signed)
To [facility] at 0900-instructed Npo after [place]-orders pending-otherwise Hg,urine pregnancy on arrival.

## 2015-05-03 ENCOUNTER — Encounter (HOSPITAL_BASED_OUTPATIENT_CLINIC_OR_DEPARTMENT_OTHER)
Admission: RE | Disposition: A | Discharge status: Home / Self Care | Payer: Self-pay | Source: Ambulatory Visit | Attending: Obstetrics and Gynecology

## 2015-05-03 ENCOUNTER — Ambulatory Visit (HOSPITAL_BASED_OUTPATIENT_CLINIC_OR_DEPARTMENT_OTHER): Payer: 59 | Admitting: Anesthesiology

## 2015-05-03 ENCOUNTER — Encounter (HOSPITAL_BASED_OUTPATIENT_CLINIC_OR_DEPARTMENT_OTHER): Payer: Self-pay | Admitting: *Deleted

## 2015-05-03 ENCOUNTER — Ambulatory Visit (HOSPITAL_BASED_OUTPATIENT_CLINIC_OR_DEPARTMENT_OTHER)
Admission: RE | Admit: 2015-05-03 | Discharge: 2015-05-03 | Disposition: A | Discharge status: Home / Self Care | Payer: 59 | Source: Ambulatory Visit | Attending: Obstetrics and Gynecology | Admitting: Obstetrics and Gynecology

## 2015-05-03 DIAGNOSIS — Z79899 Other long term (current) drug therapy: Secondary | ICD-10-CM | POA: Insufficient documentation

## 2015-05-03 DIAGNOSIS — D259 Leiomyoma of uterus, unspecified: Secondary | ICD-10-CM | POA: Insufficient documentation

## 2015-05-03 DIAGNOSIS — N803 Endometriosis of pelvic peritoneum: Secondary | ICD-10-CM | POA: Diagnosis not present

## 2015-05-03 DIAGNOSIS — D270 Benign neoplasm of right ovary: Secondary | ICD-10-CM | POA: Insufficient documentation

## 2015-05-03 HISTORY — PX: LAPAROSCOPIC OVARIAN CYSTECTOMY: SHX6248

## 2015-05-03 LAB — POCT PREGNANCY, URINE: PREG TEST UR: NEGATIVE

## 2015-05-03 LAB — TYPE AND SCREEN
ABO/RH(D): O POS
Antibody Screen: NEGATIVE

## 2015-05-03 LAB — ABO/RH: ABO/RH(D): O POS

## 2015-05-03 SURGERY — EXCISION, CYST, OVARY, LAPAROSCOPIC
Anesthesia: General | Site: Abdomen | Laterality: Right

## 2015-05-03 MED ORDER — LACTATED RINGERS IR SOLN
Status: DC | PRN
  Administered 2015-05-03: 3000 mL

## 2015-05-03 MED ORDER — HEPARIN SOD (PORK) LOCK FLUSH 100 UNIT/ML IV SOLN
INTRAVENOUS | Status: DC | PRN
  Administered 2015-05-03: 500 [IU]

## 2015-05-03 MED ORDER — CEFAZOLIN SODIUM-DEXTROSE 2-3 GM-% IV SOLR
2.0000 g | INTRAVENOUS | Status: AC
  Administered 2015-05-03: 2 g via INTRAVENOUS
  Filled 2015-05-03: qty 50

## 2015-05-03 MED ORDER — CEFAZOLIN SODIUM-DEXTROSE 2-3 GM-% IV SOLR
INTRAVENOUS | Status: AC
  Filled 2015-05-03: qty 50

## 2015-05-03 MED ORDER — LIDOCAINE HCL (CARDIAC) 20 MG/ML IV SOLN
INTRAVENOUS | Status: DC | PRN
  Administered 2015-05-03: 50 mg via INTRAVENOUS

## 2015-05-03 MED ORDER — ACETAMINOPHEN 10 MG/ML IV SOLN
INTRAVENOUS | Status: DC | PRN
Start: 2015-05-03 — End: 2015-05-03
  Administered 2015-05-03: 1000 mg via INTRAVENOUS

## 2015-05-03 MED ORDER — FENTANYL CITRATE (PF) 100 MCG/2ML IJ SOLN
INTRAMUSCULAR | Status: AC
  Filled 2015-05-03: qty 2

## 2015-05-03 MED ORDER — SUCCINYLCHOLINE CHLORIDE 20 MG/ML IJ SOLN
INTRAMUSCULAR | Status: DC | PRN
  Administered 2015-05-03: 100 mg via INTRAVENOUS

## 2015-05-03 MED ORDER — PROPOFOL 10 MG/ML IV BOLUS
INTRAVENOUS | Status: DC | PRN
  Administered 2015-05-03: 50 mg via INTRAVENOUS
  Administered 2015-05-03: 150 mg via INTRAVENOUS

## 2015-05-03 MED ORDER — STERILE WATER FOR IRRIGATION IR SOLN
Status: DC | PRN
  Administered 2015-05-03: 500 mL

## 2015-05-03 MED ORDER — MIDAZOLAM HCL 5 MG/5ML IJ SOLN
INTRAMUSCULAR | Status: DC | PRN
  Administered 2015-05-03: 2 mg via INTRAVENOUS

## 2015-05-03 MED ORDER — LIDOCAINE HCL 4 % MT SOLN
OROMUCOSAL | Status: DC | PRN
  Administered 2015-05-03: 2.5 mL via TOPICAL

## 2015-05-03 MED ORDER — ONDANSETRON HCL 4 MG/2ML IJ SOLN
INTRAMUSCULAR | Status: DC | PRN
  Administered 2015-05-03: 4 mg via INTRAVENOUS

## 2015-05-03 MED ORDER — OXYCODONE-ACETAMINOPHEN 5-325 MG PO TABS
ORAL_TABLET | ORAL | Status: AC
  Filled 2015-05-03: qty 1

## 2015-05-03 MED ORDER — METHYLENE BLUE 1 % INJ SOLN
INTRAMUSCULAR | Status: DC | PRN
  Administered 2015-05-03: 1 mL via SUBMUCOSAL

## 2015-05-03 MED ORDER — BUPIVACAINE-EPINEPHRINE 0.25% -1:200000 IJ SOLN
INTRAMUSCULAR | Status: DC | PRN
  Administered 2015-05-03: 12 mL

## 2015-05-03 MED ORDER — FENTANYL CITRATE (PF) 100 MCG/2ML IJ SOLN
25.0000 ug | INTRAMUSCULAR | Status: DC | PRN
  Administered 2015-05-03 (×2): 25 ug via INTRAVENOUS
  Filled 2015-05-03: qty 1

## 2015-05-03 MED ORDER — ONDANSETRON HCL 4 MG PO TABS: 4.0000 mg | ORAL_TABLET | Freq: Three times a day (TID) | ORAL | Status: AC | PRN

## 2015-05-03 MED ORDER — PROMETHAZINE HCL 25 MG/ML IJ SOLN
6.2500 mg | INTRAMUSCULAR | Status: DC | PRN
  Administered 2015-05-03: 6.25 mg via INTRAVENOUS
  Filled 2015-05-03: qty 1

## 2015-05-03 MED ORDER — MIDAZOLAM HCL 2 MG/2ML IJ SOLN
INTRAMUSCULAR | Status: AC
  Filled 2015-05-03: qty 2

## 2015-05-03 MED ORDER — PROMETHAZINE HCL 25 MG/ML IJ SOLN
INTRAMUSCULAR | Status: AC
  Filled 2015-05-03: qty 1

## 2015-05-03 MED ORDER — ROCURONIUM BROMIDE 100 MG/10ML IV SOLN
INTRAVENOUS | Status: DC | PRN
  Administered 2015-05-03: 15 mg via INTRAVENOUS

## 2015-05-03 MED ORDER — DEXAMETHASONE SODIUM PHOSPHATE 4 MG/ML IJ SOLN
INTRAMUSCULAR | Status: DC | PRN
  Administered 2015-05-03: 10 mg via INTRAVENOUS

## 2015-05-03 MED ORDER — NEOSTIGMINE METHYLSULFATE 10 MG/10ML IV SOLN
INTRAVENOUS | Status: DC | PRN
  Administered 2015-05-03: 4 mg via INTRAVENOUS

## 2015-05-03 MED ORDER — FENTANYL CITRATE (PF) 100 MCG/2ML IJ SOLN
INTRAMUSCULAR | Status: AC
Start: 2015-05-03 — End: 2015-05-03
  Filled 2015-05-03: qty 6

## 2015-05-03 MED ORDER — KETOROLAC TROMETHAMINE 30 MG/ML IJ SOLN
INTRAMUSCULAR | Status: DC | PRN
  Administered 2015-05-03: 30 mg via INTRAVENOUS

## 2015-05-03 MED ORDER — LACTATED RINGERS IV SOLN
INTRAVENOUS | Status: DC
  Administered 2015-05-03 (×3): via INTRAVENOUS
  Filled 2015-05-03: qty 1000

## 2015-05-03 MED ORDER — OXYCODONE-ACETAMINOPHEN 7.5-325 MG PO TABS: 1.0000 | ORAL_TABLET | ORAL | Status: DC | PRN

## 2015-05-03 MED ORDER — FENTANYL CITRATE (PF) 100 MCG/2ML IJ SOLN
INTRAMUSCULAR | Status: DC | PRN
  Administered 2015-05-03 (×4): 50 ug via INTRAVENOUS

## 2015-05-03 MED ORDER — OXYCODONE-ACETAMINOPHEN 5-325 MG PO TABS
1.0000 | ORAL_TABLET | Freq: Once | ORAL | Status: AC
  Administered 2015-05-03: 1 via ORAL
  Filled 2015-05-03: qty 1

## 2015-05-03 MED ORDER — GLYCOPYRROLATE 0.2 MG/ML IJ SOLN
INTRAMUSCULAR | Status: DC | PRN
  Administered 2015-05-03: 0.2 mg via INTRAVENOUS
  Administered 2015-05-03: 0.4 mg via INTRAVENOUS

## 2015-05-03 SURGICAL SUPPLY — 54 items
APPLICATOR COTTON TIP 6IN STRL (MISCELLANEOUS) ×3 IMPLANT
BARRIER ADHS 3X4 INTERCEED (GAUZE/BANDAGES/DRESSINGS) ×2 IMPLANT
BLADE SURG 11 STRL SS (BLADE) ×3 IMPLANT
BRR ADH 4X3 ABS CNTRL BYND (GAUZE/BANDAGES/DRESSINGS) ×1
CATH FOL 2WAY SIL 16X5 (CATHETERS) ×2 IMPLANT
COVER MAYO STAND STRL (DRAPES) ×3 IMPLANT
DRAPE UNDERBUTTOCKS STRL (DRAPE) ×3 IMPLANT
DRSG OPSITE POSTOP 3X4 (GAUZE/BANDAGES/DRESSINGS) ×2 IMPLANT
DRSG TEGADERM 2-3/8X2-3/4 SM (GAUZE/BANDAGES/DRESSINGS) ×4 IMPLANT
ELECT REM PT RETURN 9FT ADLT (ELECTROSURGICAL) ×3
ELECTRODE REM PT RTRN 9FT ADLT (ELECTROSURGICAL) ×1 IMPLANT
GAUZE SPONGE 2X2 12PLY NS (GAUZE/BANDAGES/DRESSINGS) ×4 IMPLANT
GLOVE BIOGEL PI IND STRL 6.5 (GLOVE) IMPLANT
GLOVE BIOGEL PI IND STRL 8.5 (GLOVE) ×1 IMPLANT
GLOVE BIOGEL PI INDICATOR 6.5 (GLOVE) ×4
GLOVE BIOGEL PI INDICATOR 8.5 (GLOVE) ×2
GLOVE SURG SS PI 6.5 STRL IVOR (GLOVE) ×2 IMPLANT
GLOVE SURG SS PI 8.5 STRL IVOR (GLOVE) ×2
GLOVE SURG SS PI 8.5 STRL STRW (GLOVE) IMPLANT
GOWN STRL REUS W/ TWL LRG LVL3 (GOWN DISPOSABLE) ×2 IMPLANT
GOWN STRL REUS W/TWL LRG LVL3 (GOWN DISPOSABLE) ×6
HOLDER FOLEY CATH W/STRAP (MISCELLANEOUS) IMPLANT
IV LACTATED RINGER IRRG 3000ML (IV SOLUTION) ×3
IV LR IRRIG 3000ML ARTHROMATIC (IV SOLUTION) IMPLANT
LEGGING LITHOTOMY PAIR STRL (DRAPES) ×2 IMPLANT
MANIPULATOR UTERINE 4.5 ZUMI (MISCELLANEOUS) ×3 IMPLANT
NDL HYPO 25X1 1.5 SAFETY (NEEDLE) ×1 IMPLANT
NDL INSUFFLATION 14GA 120MM (NEEDLE) ×1 IMPLANT
NEEDLE HYPO 25X1 1.5 SAFETY (NEEDLE) ×3 IMPLANT
NEEDLE INSUFFLATION 14GA 120MM (NEEDLE) ×3 IMPLANT
NS IRRIG 500ML POUR BTL (IV SOLUTION) ×3 IMPLANT
PACK BASIN DAY SURGERY FS (CUSTOM PROCEDURE TRAY) ×3 IMPLANT
PACK LAPAROSCOPY II (CUSTOM PROCEDURE TRAY) ×3 IMPLANT
PAD OB MATERNITY 4.3X12.25 (PERSONAL CARE ITEMS) ×3 IMPLANT
PADDING ION DISPOSABLE (MISCELLANEOUS) ×3 IMPLANT
PENCIL BUTTON HOLSTER BLD 10FT (ELECTRODE) ×2 IMPLANT
SET IRRIG TUBING LAPAROSCOPIC (IRRIGATION / IRRIGATOR) ×3 IMPLANT
SOLUTION ANTI FOG 6CC (MISCELLANEOUS) ×3 IMPLANT
SUT MNCRL AB 4-0 PS2 18 (SUTURE) ×3 IMPLANT
SUT VIC AB 2-0 UR6 27 (SUTURE) ×2 IMPLANT
SUT VICRYL 6 0 RB 1 (SUTURE) ×6 IMPLANT
SYR 30ML LL (SYRINGE) ×2 IMPLANT
SYR 3ML 23GX1 SAFETY (SYRINGE) ×2 IMPLANT
SYR 5ML LL (SYRINGE) ×3 IMPLANT
SYR CONTROL 10ML LL (SYRINGE) ×3 IMPLANT
SYRINGE 10CC LL (SYRINGE) ×3 IMPLANT
SYS LAPSCP GELPORT 120MM (MISCELLANEOUS) ×3
SYSTEM LAPSCP GELPORT 120MM (MISCELLANEOUS) IMPLANT
TOWEL OR 17X24 6PK STRL BLUE (TOWEL DISPOSABLE) ×6 IMPLANT
TRAY DSU PREP LF (CUSTOM PROCEDURE TRAY) ×3 IMPLANT
TROCAR OPTI TIP 5M 100M (ENDOMECHANICALS) ×5 IMPLANT
TUBING INSUFFLATION 10FT LAP (TUBING) ×3 IMPLANT
WARMER LAPAROSCOPE (MISCELLANEOUS) ×3 IMPLANT
WATER STERILE IRR 500ML POUR (IV SOLUTION) ×3 IMPLANT

## 2015-05-03 NOTE — Anesthesia Preprocedure Evaluation (Addendum)
Anesthesia Evaluation  Patient identified by MRN, date of birth, ID band Patient awake    Reviewed: Allergy & Precautions, NPO status , Patient's Chart, lab work & pertinent test results  Airway Mallampati: II  TM Distance: >3 FB Neck ROM: Full    Dental no notable dental hx.    Pulmonary neg pulmonary ROS,  breath sounds clear to auscultation  Pulmonary exam normal       Cardiovascular negative cardio ROS Normal cardiovascular examRhythm:Regular Rate:Normal     Neuro/Psych PSYCHIATRIC DISORDERS negative neurological ROS     GI/Hepatic negative GI ROS, Neg liver ROS,   Endo/Other  negative endocrine ROS  Renal/GU negative Renal ROS  negative genitourinary   Musculoskeletal negative musculoskeletal ROS (+)   Abdominal   Peds negative pediatric ROS (+)  Hematology negative hematology ROS (+)   Anesthesia Other Findings   Reproductive/Obstetrics negative OB ROS                             Anesthesia Physical Anesthesia Plan  ASA: II  Anesthesia Plan: General   Post-op Pain Management:    Induction: Intravenous  Airway Management Planned: Oral ETT  Additional Equipment:   Intra-op Plan:   Post-operative Plan: Extubation in OR  Informed Consent: I have reviewed the patients History and Physical, chart, labs and discussed the procedure including the risks, benefits and alternatives for the proposed anesthesia with the patient or authorized representative who has indicated his/her understanding and acceptance.   Dental advisory given  Plan Discussed with: CRNA  Anesthesia Plan Comments:         Anesthesia Quick Evaluation

## 2015-05-03 NOTE — Anesthesia Procedure Notes (Addendum)
Procedure Name: Intubation Date/Time: 05/03/2015 10:56 AM Performed by: Mechele Claude Pre-anesthesia Checklist: Patient identified, Emergency Drugs available, Suction available and Patient being monitored Patient Re-evaluated:Patient Re-evaluated prior to inductionOxygen Delivery Method: Circle System Utilized Preoxygenation: Pre-oxygenation with 100% oxygen Intubation Type: IV induction Ventilation: Mask ventilation without difficulty Laryngoscope Size: Mac and 3 Grade View: Grade I Tube type: Oral Tube size: 7.0 mm Number of attempts: 1 Airway Equipment and Method: Stylet,  Oral airway and LTA kit utilized Placement Confirmation: ETT inserted through vocal cords under direct vision,  positive ETCO2 and breath sounds checked- equal and bilateral Secured at: 21 cm Tube secured with: Tape Dental Injury: Teeth and Oropharynx as per pre-operative assessment

## 2015-05-03 NOTE — Discharge Instructions (Signed)
Post Anesthesia Home Care Instructions  Activity: Get plenty of rest for the remainder of the day. A responsible adult should stay with you for 24 hours following the procedure.  For the next 24 hours, DO NOT: -Drive a car -Paediatric nurse -Drink alcoholic beverages -Take any medication unless instructed by your physician -Make any legal decisions or sign important papers.  Meals: Start with liquid foods such as gelatin or soup. Progress to regular foods as tolerated. Avoid greasy, spicy, heavy foods. If nausea and/or vomiting occur, drink only clear liquids until the nausea and/or vomiting subsides. Call your physician if vomiting continues.  Special Instructions/Symptoms: Your throat may feel dry or sore from the anesthesia or the breathing tube placed in your throat during surgery. If this causes discomfort, gargle with warm salt water. The discomfort should disappear within 24 hours.  If you had a scopolamine patch placed behind your ear for the management of post- operative nausea and/or vomiting:  1. The medication in the patch is effective for 72 hours, after which it should be removed.  Wrap patch in a tissue and discard in the trash. Wash hands thoroughly with soap and water. 2. You may remove the patch earlier than 72 hours if you experience unpleasant side effects which may include dry mouth, dizziness or visual disturbances. 3. Avoid touching the patch. Wash your hands with soap and water after contact with the patch.   Diagnostic Laparoscopy Laparoscopy is a surgical procedure. It is used to diagnose and treat diseases inside the belly (abdomen). It is usually a brief, common, and relatively simple procedure. The laparoscopeis a thin, lighted, pencil-sized instrument. It is like a telescope. It is inserted into your abdomen through a small cut (incision). Your caregiver can look at the organs inside your body through this instrument. He or she can see if there is anything  abnormal. Laparoscopy can be done either in a hospital or outpatient clinic. You may be given a mild sedative to help you relax before the procedure. Once in the operating room, you will be given a drug to make you sleep (general anesthesia). Laparoscopy usually lasts less than 1 hour. After the procedure, you will be monitored in a recovery area until you are stable and doing well. Once you are home, it will take 2 to 3 days to fully recover. RISKS AND COMPLICATIONS  Laparoscopy has relatively few risks. Your caregiver will discuss the risks with you before the procedure. Some problems that can occur include:  Infection.  Bleeding.  Damage to other organs.  Anesthetic side effects. PROCEDURE Once you receive anesthesia, your surgeon inflates the abdomen with a harmless gas (carbon dioxide). This makes the organs easier to see. The laparoscope is inserted into the abdomen through a small incision. This allows your surgeon to see into the abdomen. Other small instruments are also inserted into the abdomen through other small openings. Many surgeons attach a video camera to the laparoscope to enlarge the view. During a diagnostic laparoscopy, the surgeon may be looking for inflammation, infection, or cancer. Your surgeon may take tissue samples(biopsies). The samples are sent to a specialist in looking at cells and tissue samples (pathologist). The pathologist examines them under a microscope. Biopsies can help to diagnose or confirm a disease. AFTER THE PROCEDURE   The gas is released from inside the abdomen.  The incisions are closed with stitches (sutures). Because these incisions are small (usually less than 1/2 inch), there is usually minimal discomfort after the procedure. There  may be some mild discomfort in the throat. This is from the tube placed in the throat while you were sleeping. You may have some mild abdominal discomfort. There may also be discomfort from the instrument placement  incisions in the abdomen.  The recovery time is shortened as long as there are no complications.  You will rest in a recovery room until stable and doing well. As long as there are no complications, you may be allowed to go home. FINDING OUT THE RESULTS OF YOUR TEST Not all test results are available during your visit. If your test results are not back during the visit, make an appointment with your caregiver to find out the results. Do not assume everything is normal if you have not heard from your caregiver or the medical facility. It is important for you to follow up on all of your test results. HOME CARE INSTRUCTIONS   Take all medicines as directed.  Only take over-the-counter or prescription medicines for pain, discomfort, or fever as directed by your caregiver.  Resume daily activities as directed.  Showers are preferred over baths.  You may resume sexual activities in 1 week or as directed.  Do not drive while taking narcotics. SEEK MEDICAL CARE IF:   There is increasing abdominal pain.  There is new pain in the shoulders (shoulder strap areas).  You feel lightheaded or faint.  You have the chills.  You or your child has an oral temperature above 102 F (38.9 C).  There is pus-like (purulent) drainage from any of the wounds.  You are unable to pass gas or have a bowel movement.  You feel sick to your stomach (nauseous) or throw up (vomit). MAKE SURE YOU:   Understand these instructions.  Will watch your condition.  Will get help right away if you are not doing well or get worse. Document Released: 02/03/2001 Document Revised: 02/22/2013 Document Reviewed: 10/28/2007 [facility] Patient Information 2015 [place], [place]. This information is not intended to replace advice given to you by your health care provider. Make sure you discuss any questions you have with your health care provider.

## 2015-05-03 NOTE — H&P (Signed)
Debra Howard is a 29 y.o. female , originally referred to me by Dr. 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 Patient would like to preserve her childbearing potential.  Pertinent Gynecological History: Menses: normal      Bleeding: normal Contraception: none DES exposure: denies Blood transfusions: none Sexually transmitted diseases: no past history Previous GYN Procedures: no  Last mammogram: normal Last pap: normal  OB History: G 0    Menstrual History: Menarche age: 67 No LMP recorded.    Past Medical History  Diagnosis Date  . ADD (attention deficit disorder)                     History reviewed. No pertinent past surgical history.           Family History  Problem Relation Age of Onset  . Diabetes Father   . Hypertension Father    No hereditary disease.  No cancer of breast, ovary, uterus. No cutaneous leiomyomatosis or renal cell carcinoma.  History   Social History  . Marital Status: Single    Spouse Name: N/A  . Number of Children: N/A  . Years of Education: N/A   Occupational History  . Not on file.   Social History Main Topics  . Smoking status: Never Smoker   . Smokeless tobacco: Not on file  . Alcohol Use: No  . Drug Use: No  . Sexual Activity: Yes   Other Topics Concern  . Not on file   Social History Narrative    Allergies  Allergen Reactions  . Honey Anaphylaxis  . Pineapple Anaphylaxis  . Pollen Extract Anaphylaxis  . Shellfish Allergy Anaphylaxis  . Sudafed [Pseudoephedrine Hcl] Other (See Comments)    Sedated-hard to wake up    No current facility-administered medications on file prior to encounter.   Current Outpatient Prescriptions on File Prior to Encounter  Medication Sig Dispense Refill  . amphetamine-dextroamphetamine (ADDERALL XR) 30 MG 24 hr capsule Take 30 mg by mouth daily.    . Multiple Vitamin (MULTIVITAMIN WITH MINERALS) TABS tablet Take 1 tablet by  mouth daily.    . prochlorperazine (COMPAZINE) 10 MG tablet Take 1 tablet (10 mg total) by mouth 2 (two) times daily as needed for nausea or vomiting (Nausea ). 10 tablet 0     Review of Systems  Constitutional: Negative.   HENT: Negative.   Eyes: Negative.   Respiratory: Negative.   Cardiovascular: Negative.   Gastrointestinal: Negative.   Genitourinary: Negative.   Musculoskeletal: Negative.   Skin: Negative.   Neurological: Negative.   Endo/Heme/Allergies: Negative.   Psychiatric/Behavioral: Negative.      Physical Exam  BP 119/72 mmHg  Pulse 92  Temp(Src) 98.3 F (36.8 C) (Oral)  Resp 16  Ht 5' 2.5" (1.588 m)  Wt 56.473 kg (124 lb 8 oz)  BMI 22.39 kg/m2  SpO2 100%  LMP 04/11/2015 Constitutional: She is oriented to person, place, and time. She appears well-developed and well-nourished.  HENT:  Head: Normocephalic and atraumatic.  Nose: Nose normal.  Mouth/Throat: Oropharynx is clear and moist. No oropharyngeal exudate.  Eyes: Conjunctivae normal and EOM are normal. Pupils are equal, round, and reactive to light. No scleral icterus.  Neck: Normal range of motion. Neck supple. No tracheal deviation present. No thyromegaly present.  Cardiovascular: Normal rate.   Respiratory: Effort normal and breath sounds normal.  GI: Soft. Bowel sounds are normal. She exhibits no distension and no mass. There  is no tenderness.  Lymphadenopathy:    She has no cervical adenopathy.  Neurological: She is alert and oriented to person, place, and time. She has normal reflexes.  Skin: Skin is warm.  Psychiatric: She has a normal mood and affect. Her behavior is normal. Judgment and thought content normal.       Assessment/Plan:  I recommended laparoscopy, right ovarian cystectomy, with reconstruction of the right ovary and mesosalpinx.  I reviewed the benefits and risks of the procedure.  These are bleeding, infection, injury to surrounding organs, adhesion formation, postop  infertility or ectopic pregnancy. I reviewed very low risk of malignancy in this lesion because of its simple morphology and normal resistive index by Doppler in the cyst wall vessels.

## 2015-05-03 NOTE — Op Note (Signed)
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 The vagina was normal. The cervix was displaced posteriorly and appeared normal. Bimanual exam showed a normal uterine size and mobility. There was a right lower quadrant abdominal/pelvic mass anteriorly displacing the uterus to the left. There were no left adnexal masses. There was no posterior cul-de-sac induration or nodularity.  On laparoscopy, upper abdomen, liver surface and diaphragm surfaces were normal. Gallbladder was normal. The appendix appeared normal.   he pelvic peritoneum looked mostly normal. There was a 12 cm cystic mass covering the right adnexal structures. Upon careful retraction this was noted the origin originating from the right ovary. The right tube was normal proximally and distally. 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 The left ovary appeared normal, without evidence of a dermoid. The left tube was normal proximally and distally. Chromotubation showed patency of both fallopian tubes. There were brown pigmented lesions of endometriosis in the left ovarian fossa. Additionally there were clear lesions and stellate fibrosis in both left ovarian and right ovarian fossa. There was brown lesion of endometriosis on the left uterosacral ligament as well. All of these lesions were ablated with needle electrode in cutting current at 65 W.  Description of the  procedure: The patient was placed in dorsal supine position and general endotracheal anesthesia was given. 2 g of cefazolin were given intravenously for prophylaxis. Patient was placed in lithotomy position. She was prepped and draped inside manner.a Foley catheter was inserted into the bladder. A ZUMI catheter was placed into the uterine cavity.  the uterus sounded to 7.5 cm. The surgeon was regloved and a surgical field was created on the abdomen.  After preemptive anesthesia of all surgical sites with 0.25% bupivacaine with 1 200,000 epinephrine, a 5 mm  Supraumbilical ncision was made and a Verress needle was inserted. Its correct location was confirmed. A pneumoperitoneum was created with carbon dioxide.  5 mm laparoscope with a 30 lens was inserted and video laparoscopy was started . A left lower quadrant 5 mm was made and ancillary trochar was placed under direct visualization. Above findings were noted.  For safer and faster drainage of the right adnexal cyst, I opted to make a 3 cm supraumbilical skin incision at the pubic hairline. After dissection of the anatomic layers, peritoneal cavity was entered. A GelPort was placed and the rest of the procedure was carried out sometimes using this as a GelPort, and sometimes opening the port and using it as a minilaparotomy site. A 5 mm incision was made on the cyst wall, after ovarian cortex resection from the underlying cyst wall was initiated with the help of an incision using #15 blade. Large amount of clear yellow colored fluid was aspirated. After complete decompression of the right ovary, the ovary could be exteriorized. With the help of DeBakey forceps the ovarian cortex incision was enlarged and the cyst wall was easily and completely detached from the ovarian cortex. When we get to the medial aspect of the cyst, we encountered other loculations that  were filled with sebaceous material with hair in it, suggesting a dermoid. The complex cyst was  entirely removed from the ovarian cortex and hemostasis was insured. The dissection also included the Rokitansky nodule of the dermoid. All of this was submitted to pathology.  The resulting due to redundancy the ovarian cortex was managed by ruffling it on 3 rows of 6-0 Vicryl sutures, which were then tied onto themselves, reconstructing the ovary and providing hemostasis. The ovary was dropped back into the pelvis and laparoscopy was resumed.  We carefully inspected the left ovary and determined that there was no contralateral dermoid cysts on this ovary. Endometriotic implants were noted and they were ablated down to healthy subperitoneal tissue, using the needle electrode at a setting of 35 W cutting. Care was taken not to injure the underlying ureters and branches of the uterine artery. Chromotubation was performed showing bilateral tubal patency.  We decided not to remove the posterior pedunculated myoma, in order not to set up this patient for further adhesion formation.   The pelvis was copiously irrigated. Then it was aspirated. A 3 x 5" piece of Interceed was dropped into the pelvis and the right ovary was wrapped in it. Instrument and lap pad count were correct. The GelPort was closed with 2-0 Vicryl on the fascia. After irrigation of the subcutaneous layers, skin was approximated with 4-0 Monocryl in subcutaneous testicular sutures. The rest of the laparoscopy sites were approximated with 4-0 Monocryl in subcuticular stitches.  The patient tolerated the procedure well and was transferred to recovery room in satisfactory condition.  [facility]  The patient tolerated the procedure well and was transferred to recovery room in satisfactory condition.

## 2015-05-03 NOTE — Transfer of Care (Signed)
Last Vitals:  Filed Vitals:   05/03/15 0932  BP: 119/72  Pulse: 92  Temp: 36.8 C  Resp: 16    Immediate Anesthesia Transfer of Care Note  Patient: Debra Howard  Procedure(s) Performed: Procedure(s) (LRB): LAPAROSCOPIC GEL PORT ASSISTED RIGHT  OVARIAN CYSTECTOMY, ELECTROSURGICAL ABLATION OF ENDOMETRIOSIS (Right)  Patient Location: PACU  Anesthesia Type: General  Level of Consciousness: awake, alert  and oriented  Airway & Oxygen Therapy: Patient Spontanous Breathing and Patient connected to nasal cannula oxygen  Post-op Assessment: Report given to PACU RN and Post -op Vital signs reviewed and stable  Post vital signs: Reviewed and stable  Complications: No apparent anesthesia complications

## 2015-05-03 NOTE — Anesthesia Postprocedure Evaluation (Signed)
  Anesthesia Post-op Note  Patient: Debra Howard  Procedure(s) Performed: Procedure(s) (LRB): LAPAROSCOPIC GEL PORT ASSISTED RIGHT  OVARIAN CYSTECTOMY, ELECTROSURGICAL ABLATION OF ENDOMETRIOSIS (Right)  Patient Location: PACU  Anesthesia Type: General  Level of Consciousness: awake and alert   Airway and Oxygen Therapy: Patient Spontanous Breathing  Post-op Pain: mild  Post-op Assessment: Post-op Vital signs reviewed, Patient's Cardiovascular Status Stable, Respiratory Function Stable, Patent Airway and No signs of Nausea or vomiting  Last Vitals:  Filed Vitals:   05/03/15 1415  BP:   Pulse: 63  Temp:   Resp: 16    Post-op Vital Signs: stable   Complications: No apparent anesthesia complications

## 2015-05-04 ENCOUNTER — Encounter (HOSPITAL_BASED_OUTPATIENT_CLINIC_OR_DEPARTMENT_OTHER): Payer: Self-pay | Admitting: Obstetrics and Gynecology

## 2015-05-06 ENCOUNTER — Inpatient Hospital Stay (HOSPITAL_COMMUNITY)
Admission: AD | Admit: 2015-05-06 | Discharge: 2015-05-06 | Disposition: A | Discharge status: Home / Self Care | Payer: 59 | Source: Ambulatory Visit | Attending: Obstetrics & Gynecology | Admitting: Obstetrics & Gynecology

## 2015-05-06 DIAGNOSIS — G8918 Other acute postprocedural pain: Secondary | ICD-10-CM

## 2015-05-06 DIAGNOSIS — R079 Chest pain, unspecified: Secondary | ICD-10-CM | POA: Diagnosis present

## 2015-05-06 MED ORDER — TRAMADOL HCL 50 MG PO TABS
50.0000 mg | ORAL_TABLET | Freq: Once | ORAL | Status: AC
  Administered 2015-05-06: 50 mg via ORAL
  Filled 2015-05-06: qty 1

## 2015-05-06 MED ORDER — TRAMADOL HCL 50 MG PO TABS: 50.0000 mg | ORAL_TABLET | Freq: Four times a day (QID) | ORAL | Status: AC | PRN

## 2015-05-06 NOTE — Discharge Instructions (Signed)
Take pain and nausea medications as needed to control pain or nausea/vomiting. Continue taking Gas-X and stool softener until having normal bowel movements. Make sure you are eating regularly and staying well hydrated. If you develop another episode of chest pain, heart palpitations or shortness of breath, go to [facility] ED.

## 2015-05-06 NOTE — MAU Note (Signed)
Pt states here for chest pain that began the night she got home from surgery. Doesn't have an appetite and has been trying to keep up fluids. Has had dizziness and feels heart beat very strong through whole body. Took aleve this am. Vagina is severely swollen also.

## 2015-05-06 NOTE — MAU Provider Note (Signed)
History     CSN: [id number]  Arrival date and time: 05/06/15 1035    First Provider Initiated Contact with Patient 05/06/15 1054      Chief Complaint  Patient presents with  . Chest Pain  . Heartburn   HPI 29 y.o. female s/p laporoscopic R ovarian cystectomy on 05/03/15. Pt states she took a percocet last night, then went to bed. She then had an episode of chest pain, "felt my heartbeat all over my body". These symptoms resolved spontaneously. She now denies chest pain or shortness of breath, heart palpitations. She does state she gets intermittent pains in her epigastric area and bilateral shoulders and has a headache this morning that has not decreased w/ Aleve. She states she has some incisional pain w/ movement, but no significant abdominal pain. She does report that her "vagina is much bigger than usual" and is "black" in color.   Past Medical History  Diagnosis Date  . ADD (attention deficit disorder)     Past Surgical History  Procedure Laterality Date  . Laparoscopic ovarian cystectomy Right 05/03/2015    Procedure: LAPAROSCOPIC GEL PORT ASSISTED RIGHT  OVARIAN CYSTECTOMY, ELECTROSURGICAL ABLATION OF ENDOMETRIOSIS;  Surgeon: Governor Specking, MD;  Location: [place];  Service: Gynecology;  Laterality: Right;    Family History  Problem Relation Age of Onset  . Diabetes Father   . Hypertension Father     History  Substance Use Topics  . Smoking status: Never Smoker   . Smokeless tobacco: Not on file  . Alcohol Use: No    Allergies:  Allergies  Allergen Reactions  . Bee Venom Anaphylaxis  . Honey Anaphylaxis  . Pineapple Anaphylaxis  . Pollen Extract Anaphylaxis  . Shellfish Allergy Anaphylaxis  . Latex Other (See Comments)    Not sure  . Sudafed [Pseudoephedrine Hcl] Other (See Comments)    Sedated-hard to wake up    Prescriptions prior to admission  Medication Sig Dispense Refill Last Dose  . amphetamine-dextroamphetamine (ADDERALL XR)  30 MG 24 hr capsule Take 30 mg by mouth daily.   05/02/2015 at Unknown time  . Multiple Vitamin (MULTIVITAMIN WITH MINERALS) TABS tablet Take 1 tablet by mouth daily.   More than a month at Unknown time  . ondansetron (ZOFRAN) 4 MG tablet Take 1 tablet (4 mg total) by mouth every 8 (eight) hours as needed for nausea or vomiting. 20 tablet 0   . oxyCODONE-acetaminophen (PERCOCET) 7.5-325 MG per tablet Take 1 tablet by mouth every 4 (four) hours as needed. 30 tablet 0   . prochlorperazine (COMPAZINE) 10 MG tablet Take 1 tablet (10 mg total) by mouth 2 (two) times daily as needed for nausea or vomiting (Nausea ). 10 tablet 0 More than a month at Unknown time    Review of Systems  Constitutional: Negative for fever, chills and malaise/fatigue.  Eyes: Negative for blurred vision.  Respiratory: Negative for shortness of breath.   Cardiovascular:       See HPI  Gastrointestinal: Negative for nausea, vomiting and abdominal pain.  Neurological: Positive for headaches.   Physical Exam   Blood pressure 130/73, pulse 105, temperature 98.1 F (36.7 C), temperature source Oral, resp. rate 16, height 5' 2.5" (1.588 m), weight 127 lb 4 oz (57.72 kg), last menstrual period 04/11/2015.  Physical Exam  Nursing note and vitals reviewed. Constitutional: She is oriented to person, place, and time. She appears well-developed and well-nourished. No distress.  Cardiovascular: Normal rate, regular rhythm and normal heart sounds.  Exam reveals no gallop and no friction rub.   No murmur heard. Respiratory: Effort normal and breath sounds normal. No respiratory distress. She has no wheezes. She has no rales.  GI: Soft. There is no tenderness.  Bruising from umbilicus down to bilateral labia and over to L flank  Genitourinary:  Bruising noted to bilateral labia majora, but no significant edema, soft and nontender  Musculoskeletal: Normal range of motion.  Neurological: She is alert and oriented to person, place,  and time.  Skin: Skin is warm and dry.  Psychiatric: She has a normal mood and affect.    MAU Course  Procedures EKG w/ no abnormality, normal sinus rhythm  Good pain relief w/ Tramadol Assessment and Plan   1. Postoperative pain   Episode last night of "chest pain" possibly related to reaction to Percocet, pt states she doesn't do well with these types of medications. Tolerated Tramadol well today, rx provided, may use as needed for post-op pain. Rev'd precautions, go to [facility] ED if chest pain recurs. Normal exam at this time. Follow up as scheduled for post-op visit.     Medication List    STOP taking these medications        ibuprofen 200 MG tablet  Commonly known as:  ADVIL,MOTRIN     oxyCODONE-acetaminophen 7.5-325 MG per tablet  Commonly known as:  PERCOCET     prochlorperazine 10 MG tablet  Commonly known as:  COMPAZINE      TAKE these medications        amphetamine-dextroamphetamine 30 MG 24 hr capsule  Commonly known as:  ADDERALL XR  Take 30 mg by mouth daily.     bisacodyl 5 MG EC tablet  Generic drug:  bisacodyl  Take 5 mg by mouth daily as needed for moderate constipation.     multivitamin with minerals Tabs tablet  Take 1 tablet by mouth daily.     naproxen sodium 220 MG tablet  Commonly known as:  ANAPROX  Take 220 mg by mouth 2 (two) times daily with a meal.     ondansetron 4 MG tablet  Commonly known as:  ZOFRAN  Take 1 tablet (4 mg total) by mouth every 8 (eight) hours as needed for nausea or vomiting.     simethicone 125 MG chewable tablet  Commonly known as:  MYLICON  Chew 734 mg by mouth every 6 (six) hours as needed for flatulence.     traMADol 50 MG tablet  Commonly known as:  ULTRAM  Take 1 tablet (50 mg total) by mouth every 6 (six) hours as needed.            Follow-up Information    Follow up with your provider.   Why:  as scheduled        Correll Denbow 05/06/2015, 10:56 AM

## 2015-05-09 LAB — POCT HEMOGLOBIN-HEMACUE: Hemoglobin: 10.8 g/dL — ABNORMAL LOW (ref 12.0–15.0)

## 2015-09-13 ENCOUNTER — Encounter: Payer: Self-pay | Admitting: *Deleted

## 2015-12-30 ENCOUNTER — Encounter: Payer: Self-pay | Admitting: *Deleted

## 2016-02-19 ENCOUNTER — Other Ambulatory Visit: Payer: Self-pay | Admitting: Obstetrics & Gynecology

## 2016-02-19 DIAGNOSIS — J0110 Acute frontal sinusitis, unspecified: Secondary | ICD-10-CM

## 2016-02-19 MED ORDER — AZITHROMYCIN 250 MG PO TABS
ORAL_TABLET | ORAL | Status: AC
Start: 2016-02-19 — End: ?

## 2016-08-01 IMAGING — CR DG TOE 3RD 2+V*L*
2 series · 2 of 2 positions shown · non-contrast
Comparison: None.

CLINICAL DATA: Injured third toe 1 day ago.  Pain and swelling.

EXAM:
LEFT THIRD TOE

[view not recorded (1 of 2)]
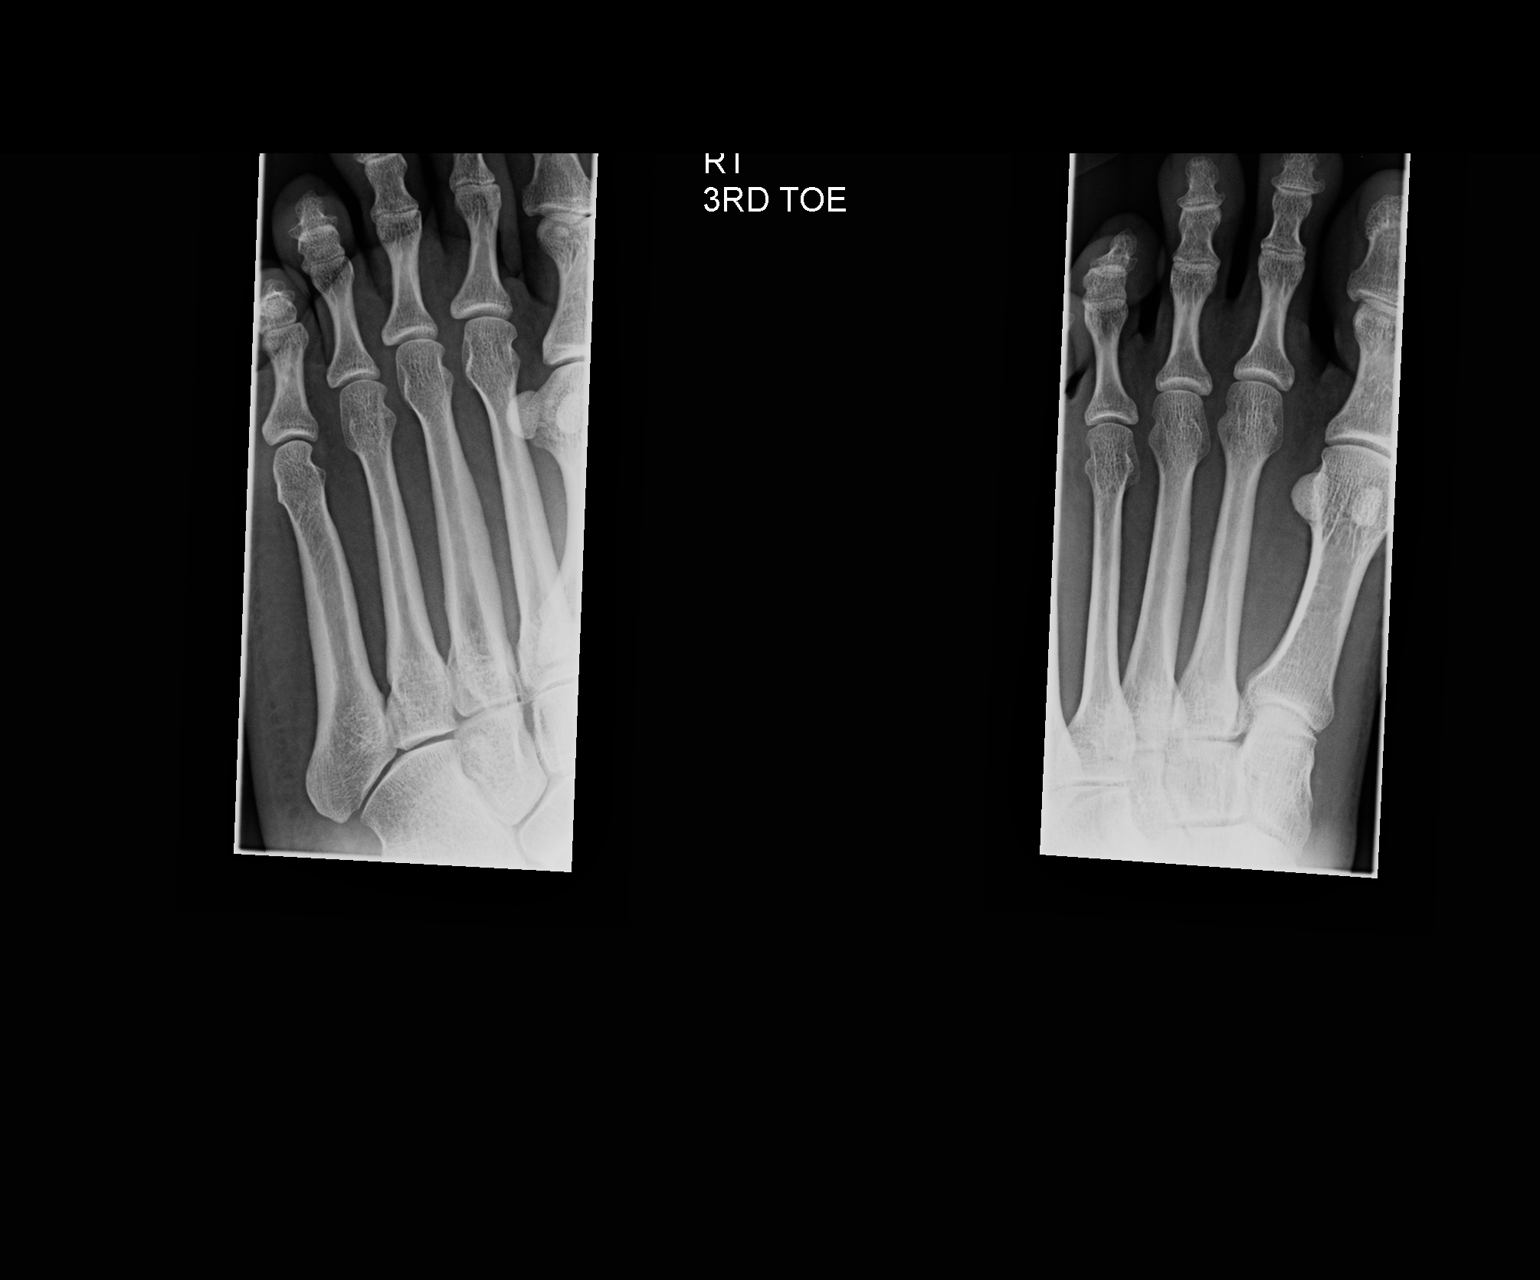

[view not recorded (2 of 2)]
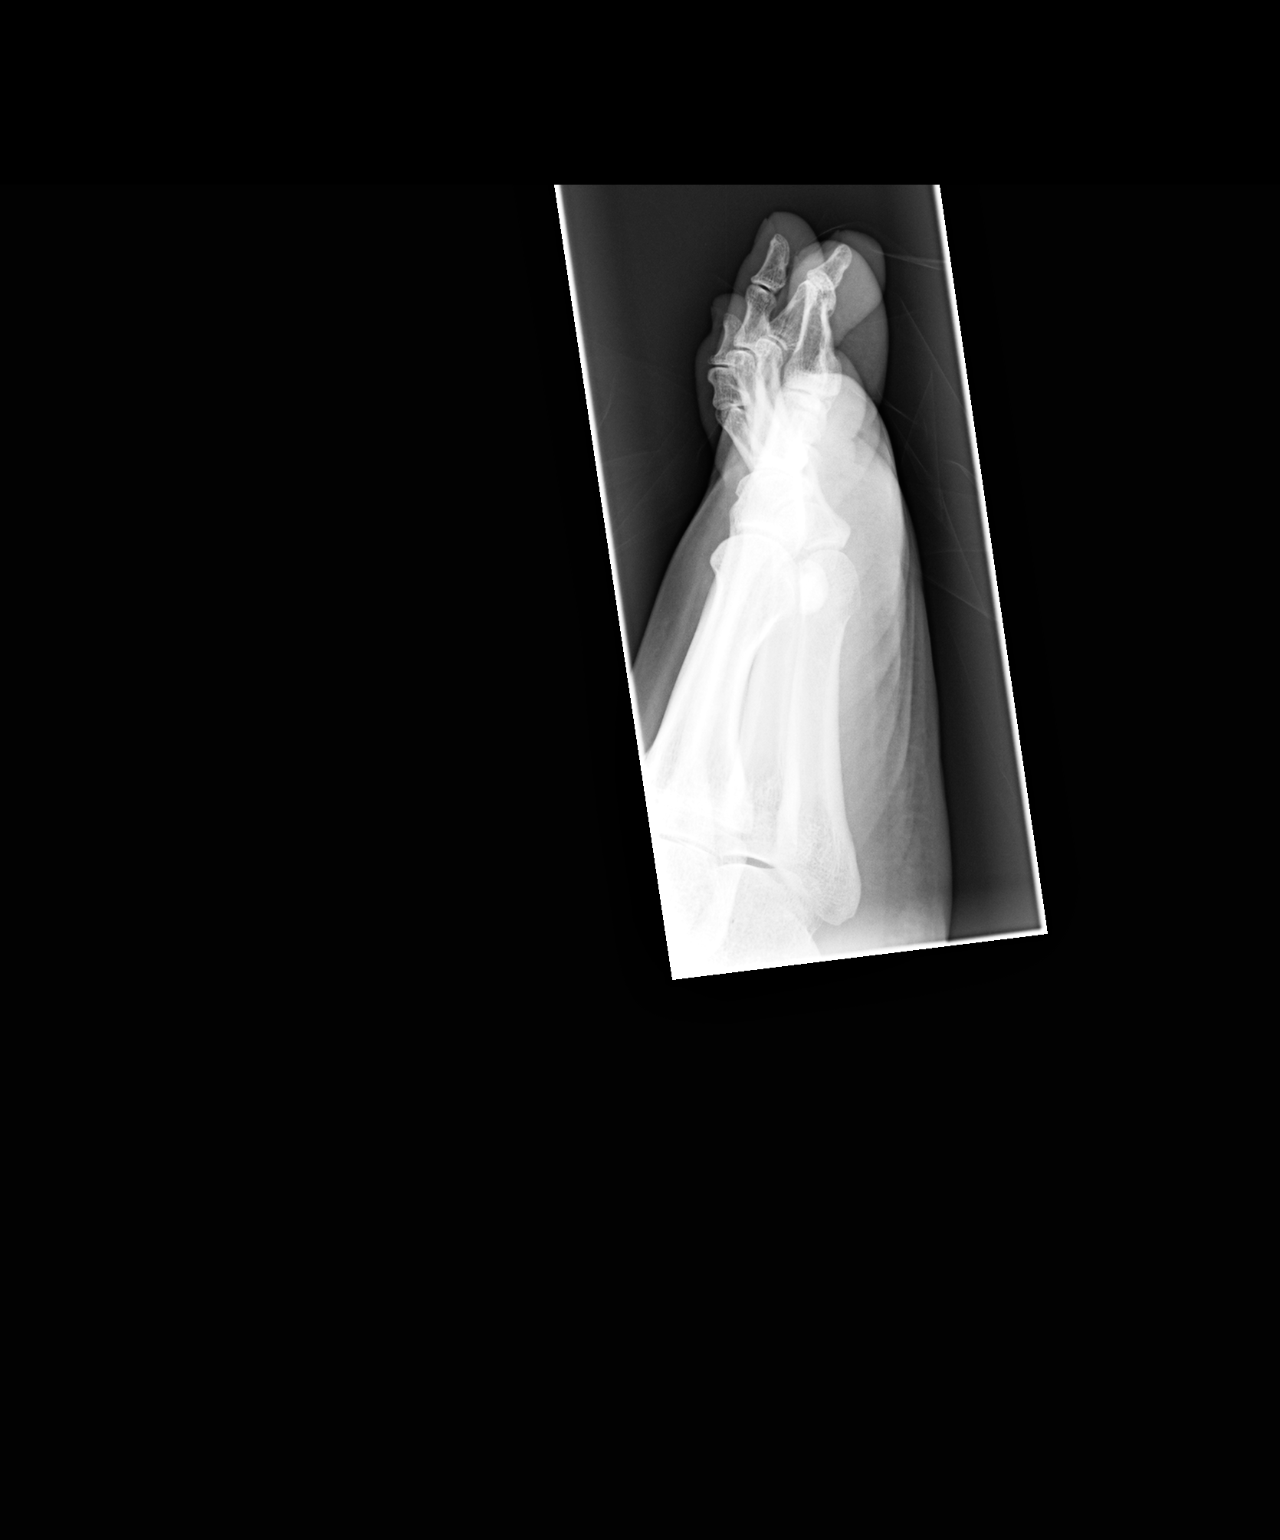

[2 of 2 positions shown; findings below may reference images not displayed]

FINDINGS: The joint spaces are maintained. Possible tiny avulsion fracture
involving the dorsal aspect of the distal phalanx at the DIP joint.
IMPRESSION: Possible tiny avulsion fracture at the DIP joint of the third toe.

## 2018-04-09 ENCOUNTER — Other Ambulatory Visit: Payer: Self-pay

## 2018-04-09 DIAGNOSIS — E538 Deficiency of other specified B group vitamins: Secondary | ICD-10-CM

## 2018-04-10 ENCOUNTER — Other Ambulatory Visit: Payer: Self-pay | Admitting: Obstetrics & Gynecology

## 2018-04-10 LAB — CBC
Hematocrit: 39.4 % (ref 34.0–46.6)
Hemoglobin: 12.9 g/dL (ref 11.1–15.9)
MCH: 30.1 pg (ref 26.6–33.0)
MCHC: 32.7 g/dL (ref 31.5–35.7)
MCV: 92 fL (ref 79–97)
Platelets: 184 10*3/uL (ref 150–450)
RBC: 4.29 x10E6/uL (ref 3.77–5.28)
RDW: 13.5 % (ref 12.3–15.4)
WBC: 6.5 10*3/uL (ref 3.4–10.8)

## 2018-04-10 LAB — VITAMIN D 25 HYDROXY (VIT D DEFICIENCY, FRACTURES): VIT D 25 HYDROXY: 10 ng/mL — AB (ref 30.0–100.0)

## 2018-04-10 LAB — VITAMIN B12: Vitamin B-12: 1082 pg/mL (ref 232–1245)

## 2018-04-10 MED ORDER — VITAMIN D (ERGOCALCIFEROL) 1.25 MG (50000 UNIT) PO CAPS: 50000.0000 [IU] | ORAL_CAPSULE | ORAL | 1 refills | Status: AC
